# Patient Record
Sex: Male | Born: 1975 | Race: Black or African American | Hispanic: No | Marital: Single | Smoking: Current every day smoker
Health system: Southern US, Community
[De-identification: ages and names within clinical notes are randomized; demographics above are authoritative.]

---

## 2008-08-29 ENCOUNTER — Emergency Department (HOSPITAL_COMMUNITY): Admission: EM | Admit: 2008-08-29 | Discharge: 2008-08-29 | Payer: Self-pay | Admitting: *Deleted

## 2013-12-10 ENCOUNTER — Emergency Department (HOSPITAL_COMMUNITY): Admission: EM | Admit: 2013-12-10 | Discharge: 2013-12-10 | Discharge status: Absent without leave | Payer: Self-pay

## 2015-02-20 ENCOUNTER — Emergency Department (HOSPITAL_COMMUNITY)
Admission: EM | Admit: 2015-02-20 | Discharge: 2015-02-20 | Disposition: A | Discharge status: Home / Self Care | Payer: Self-pay

## 2015-02-20 NOTE — ED Notes (Signed)
Attempted to call patient x 3 and no response. Patient was in waiting room early and could not locate to bring back to triage

## 2016-11-20 ENCOUNTER — Encounter (HOSPITAL_COMMUNITY): Payer: Self-pay

## 2016-11-20 DIAGNOSIS — R1030 Lower abdominal pain, unspecified: Secondary | ICD-10-CM | POA: Insufficient documentation

## 2016-11-20 DIAGNOSIS — Z5321 Procedure and treatment not carried out due to patient leaving prior to being seen by health care provider: Secondary | ICD-10-CM | POA: Insufficient documentation

## 2016-11-20 LAB — COMPREHENSIVE METABOLIC PANEL
ALK PHOS: 66 U/L (ref 38–126)
ALT: 100 U/L — AB (ref 17–63)
ANION GAP: 11 (ref 5–15)
AST: 103 U/L — ABNORMAL HIGH (ref 15–41)
Albumin: 4.1 g/dL (ref 3.5–5.0)
BILIRUBIN TOTAL: 0.2 mg/dL — AB (ref 0.3–1.2)
BUN: 5 mg/dL — ABNORMAL LOW (ref 6–20)
CALCIUM: 8.5 mg/dL — AB (ref 8.9–10.3)
CO2: 20 mmol/L — ABNORMAL LOW (ref 22–32)
Chloride: 107 mmol/L (ref 101–111)
Creatinine, Ser: 0.58 mg/dL — ABNORMAL LOW (ref 0.61–1.24)
Glucose, Bld: 137 mg/dL — ABNORMAL HIGH (ref 65–99)
Potassium: 3.6 mmol/L (ref 3.5–5.1)
Sodium: 138 mmol/L (ref 135–145)
TOTAL PROTEIN: 6.9 g/dL (ref 6.5–8.1)

## 2016-11-20 LAB — CBC
HCT: 41.7 % (ref 39.0–52.0)
HEMOGLOBIN: 14.2 g/dL (ref 13.0–17.0)
MCH: 33.2 pg (ref 26.0–34.0)
MCHC: 34.1 g/dL (ref 30.0–36.0)
MCV: 97.4 fL (ref 78.0–100.0)
PLATELETS: 293 10*3/uL (ref 150–400)
RBC: 4.28 MIL/uL (ref 4.22–5.81)
RDW: 14.8 % (ref 11.5–15.5)
WBC: 9.8 10*3/uL (ref 4.0–10.5)

## 2016-11-20 LAB — URINALYSIS, ROUTINE W REFLEX MICROSCOPIC
Bacteria, UA: NONE SEEN
Bilirubin Urine: NEGATIVE
Glucose, UA: NEGATIVE mg/dL
Hgb urine dipstick: NEGATIVE
Ketones, ur: NEGATIVE mg/dL
Leukocytes, UA: NEGATIVE
NITRITE: NEGATIVE
PH: 5 (ref 5.0–8.0)
Protein, ur: NEGATIVE mg/dL
SPECIFIC GRAVITY, URINE: 1.015 (ref 1.005–1.030)

## 2016-11-20 LAB — LIPASE, BLOOD: Lipase: 24 U/L (ref 11–51)

## 2016-11-20 MED ORDER — ACETAMINOPHEN 500 MG PO TABS
ORAL_TABLET | ORAL | Status: AC
  Filled 2016-11-20: qty 1

## 2016-11-20 MED ORDER — ONDANSETRON 4 MG PO TBDP
ORAL_TABLET | ORAL | Status: AC
  Filled 2016-11-20: qty 1

## 2016-11-20 NOTE — ED Triage Notes (Signed)
Pt presents to the ed with complaints of lower abdominal pain that feels like someone is squeezing states it has been going on all day and he has thrown up several times today.

## 2016-11-21 ENCOUNTER — Emergency Department (HOSPITAL_COMMUNITY)
Admission: EM | Admit: 2016-11-21 | Discharge: 2016-11-21 | Disposition: A | Discharge status: Home / Self Care | Payer: Self-pay | Attending: Emergency Medicine | Admitting: Emergency Medicine

## 2016-12-02 ENCOUNTER — Emergency Department (HOSPITAL_COMMUNITY)
Admission: EM | Admit: 2016-12-02 | Discharge: 2016-12-03 | Disposition: A | Discharge status: Home / Self Care | Payer: Self-pay | Attending: Emergency Medicine | Admitting: Emergency Medicine

## 2016-12-02 ENCOUNTER — Encounter (HOSPITAL_COMMUNITY): Payer: Self-pay | Admitting: Emergency Medicine

## 2016-12-02 DIAGNOSIS — F10920 Alcohol use, unspecified with intoxication, uncomplicated: Secondary | ICD-10-CM

## 2016-12-02 DIAGNOSIS — F17200 Nicotine dependence, unspecified, uncomplicated: Secondary | ICD-10-CM | POA: Insufficient documentation

## 2016-12-02 DIAGNOSIS — F1020 Alcohol dependence, uncomplicated: Secondary | ICD-10-CM | POA: Diagnosis present

## 2016-12-02 DIAGNOSIS — F1014 Alcohol abuse with alcohol-induced mood disorder: Secondary | ICD-10-CM | POA: Insufficient documentation

## 2016-12-02 DIAGNOSIS — Z046 Encounter for general psychiatric examination, requested by authority: Secondary | ICD-10-CM | POA: Insufficient documentation

## 2016-12-02 LAB — COMPREHENSIVE METABOLIC PANEL
ALT: 30 U/L (ref 17–63)
AST: 44 U/L — ABNORMAL HIGH (ref 15–41)
Albumin: 4.8 g/dL (ref 3.5–5.0)
Alkaline Phosphatase: 59 U/L (ref 38–126)
Anion gap: 12 (ref 5–15)
BUN: 8 mg/dL (ref 6–20)
CHLORIDE: 109 mmol/L (ref 101–111)
CO2: 21 mmol/L — AB (ref 22–32)
CREATININE: 0.68 mg/dL (ref 0.61–1.24)
Calcium: 8.6 mg/dL — ABNORMAL LOW (ref 8.9–10.3)
GFR calc Af Amer: >60 mL/min (ref >60)
GFR calc non Af Amer: >60 mL/min (ref >60)
Glucose, Bld: 91 mg/dL (ref 65–99)
POTASSIUM: 3.8 mmol/L (ref 3.5–5.1)
SODIUM: 142 mmol/L (ref 135–145)
Total Bilirubin: 0.5 mg/dL (ref 0.3–1.2)
Total Protein: 7.9 g/dL (ref 6.5–8.1)

## 2016-12-02 LAB — CBC
HCT: 41.7 % (ref 39.0–52.0)
HEMOGLOBIN: 14.6 g/dL (ref 13.0–17.0)
MCH: 33.8 pg (ref 26.0–34.0)
MCHC: 35 g/dL (ref 30.0–36.0)
MCV: 96.5 fL (ref 78.0–100.0)
Platelets: 266 10*3/uL (ref 150–400)
RBC: 4.32 MIL/uL (ref 4.22–5.81)
RDW: 14.6 % (ref 11.5–15.5)
WBC: 8.8 10*3/uL (ref 4.0–10.5)

## 2016-12-02 LAB — ETHANOL
ALCOHOL ETHYL (B): 237 mg/dL — AB (ref <5)
Alcohol, Ethyl (B): 416 mg/dL (ref <5)

## 2016-12-02 LAB — RAPID URINE DRUG SCREEN, HOSP PERFORMED
AMPHETAMINES: NOT DETECTED
BENZODIAZEPINES: NOT DETECTED
Barbiturates: NOT DETECTED
COCAINE: NOT DETECTED
OPIATES: NOT DETECTED
TETRAHYDROCANNABINOL: NOT DETECTED

## 2016-12-02 LAB — ACETAMINOPHEN LEVEL

## 2016-12-02 LAB — SALICYLATE LEVEL

## 2016-12-02 MED ORDER — LORAZEPAM 2 MG/ML IJ SOLN: 0.0000 mg | Freq: Two times a day (BID) | INTRAMUSCULAR | Status: DC

## 2016-12-02 MED ORDER — LORAZEPAM 1 MG PO TABS: 0.0000 mg | ORAL_TABLET | Freq: Four times a day (QID) | ORAL | Status: DC

## 2016-12-02 MED ORDER — LORAZEPAM 1 MG PO TABS: 0.0000 mg | ORAL_TABLET | Freq: Two times a day (BID) | ORAL | Status: DC

## 2016-12-02 MED ORDER — LORAZEPAM 2 MG/ML IJ SOLN: 0.0000 mg | Freq: Four times a day (QID) | INTRAMUSCULAR | Status: DC

## 2016-12-02 MED ORDER — ONDANSETRON HCL 4 MG PO TABS: 4.0000 mg | ORAL_TABLET | Freq: Three times a day (TID) | ORAL | Status: DC | PRN

## 2016-12-02 MED ORDER — VITAMIN B-1 100 MG PO TABS
100.0000 mg | ORAL_TABLET | Freq: Every day | ORAL | Status: DC
Start: 2016-12-03 — End: 2016-12-03

## 2016-12-02 MED ORDER — ALUM & MAG HYDROXIDE-SIMETH 200-200-20 MG/5ML PO SUSP: 30.0000 mL | Freq: Four times a day (QID) | ORAL | Status: DC | PRN

## 2016-12-02 MED ORDER — THIAMINE HCL 100 MG/ML IJ SOLN: 100.0000 mg | Freq: Every day | INTRAMUSCULAR | Status: DC

## 2016-12-02 MED ORDER — ACETAMINOPHEN 325 MG PO TABS: 650.0000 mg | ORAL_TABLET | ORAL | Status: DC | PRN

## 2016-12-02 MED ORDER — NICOTINE 21 MG/24HR TD PT24: 21.0000 mg | MEDICATED_PATCH | Freq: Every day | TRANSDERMAL | Status: DC

## 2016-12-02 NOTE — ED Notes (Signed)
ED Provider at bedside. 

## 2016-12-02 NOTE — ED Provider Notes (Signed)
[facility]-EMERGENCY DEPT Provider Note   CSN: [id number] Arrival date & time: 12/02/16  1554     History   Chief Complaint Chief Complaint  Patient presents with  . IVC    HPI Billy Diaz is a 41 y.o. male who presents to the ED under IVC. Paperwork states that he is an ETOH abuser and that he states that he wanted to kill himself. Paperwork also states that he is aggressive and abusive.Patient denies SI/HI/AVH. He states that his wife has been hanging out with a friend who is prostituting. He says that when he came home the friend was with his wife. He says that he left and would not speak to her, so she took out paperwork. Patient has no medical complaints. He denies illicit drug use.  He has no previous psych hx.  HPI  History reviewed. No pertinent past medical history.  There are no active problems to display for this patient.   History reviewed. No pertinent surgical history.     Home Medications    Prior to Admission medications   Not on File    Family History No family history on file.  Social History Social History  Substance Use Topics  . Smoking status: Current Every Day Smoker  . Smokeless tobacco: Never Used  . Alcohol use Yes     Comment: occassionally      Allergies   Patient has no known allergies.   Review of Systems Review of Systems  Ten systems reviewed and are negative for acute change, except as noted in the HPI.   Physical Exam Updated Vital Signs BP (!) 144/105 (BP Location: Left Arm)   Pulse 78   Temp 98.5 F (36.9 C) (Oral)   Resp 18   SpO2 98%   Physical Exam  Constitutional: He is oriented to person, place, and time. He appears well-developed and well-nourished. No distress.  HENT:  Head: Normocephalic and atraumatic.  Eyes: Conjunctivae are normal. No scleral icterus.  Neck: Normal range of motion. Neck supple.  Cardiovascular: Normal rate, regular rhythm and normal heart sounds.   Pulmonary/Chest: Effort normal and  breath sounds normal. No respiratory distress.  Abdominal: Soft. There is no tenderness.  Musculoskeletal: He exhibits no edema.  Neurological: He is alert and oriented to person, place, and time.  Answer questions appropriately Mild nystagmus suggestive of ETOH intoxication, however he appears clinically sober  Skin: Skin is warm and dry. He is not diaphoretic.  Psychiatric: His behavior is normal.  Nursing note and vitals reviewed.    ED Treatments / Results  Labs (all labs ordered are listed, but only abnormal results are displayed) Labs Reviewed  COMPREHENSIVE METABOLIC PANEL  ETHANOL  SALICYLATE LEVEL  ACETAMINOPHEN LEVEL  CBC  RAPID URINE DRUG SCREEN, HOSP PERFORMED    EKG  EKG Interpretation None       Radiology No results found.  Procedures Procedures (including critical care time)  Medications Ordered in ED Medications - No data to display   Initial Impression / Assessment and Plan / ED Course  I have reviewed the triage vital signs and the nursing notes.  Pertinent labs & imaging results that were available during my care of the patient were reviewed by me and considered in my medical decision making (see chart for details).  Clinical Course as of Dec 03 129  [name] Dec 02, 2016  2321 Alcohol, Ethyl (B): (!!) 416 [AH]    Clinical Course User Index [AH] Arthor Captain, PA-C  Patient under involuntary commitment. His alcohol level is 237. He appears clinically sober and appropriate for evaluation. He is otherwise medically clear for psychiatric consult. Final Clinical Impressions(s) / ED Diagnoses   Final diagnoses:  Involuntary commitment  Alcoholic intoxication without complication [facility](HCC)    New Prescriptions New Prescriptions   No medications on file     Arthor CaptainHarris, Dajane Valli, Cordelia Poche-C 12/03/16 0132    Rolland PorterJames, Mark, MD 12/12/16 1308

## 2016-12-02 NOTE — ED Triage Notes (Signed)
Patient brought in by [employer] under IVC papers that state: "A danger to self and others, to wit: abuses alcohol daily and when drunk becomes aggressive and abusive; drives drunk, saying he wants to die and would be better off dead."

## 2016-12-02 NOTE — ED Notes (Signed)
Pt is sleeping.  Respirations are even and unlabored.  Will continue to monitor.  Sitter at bedside.

## 2016-12-02 NOTE — ED Notes (Signed)
Bed: [facility] Expected date:  Expected time:  Means of arrival:  Comments: 

## 2016-12-02 NOTE — BH Assessment (Addendum)
Assessment Note  Billy BowMohamed Diaz is an 41 y.o. male. [facility] [employer] from his home. IVC initiated by patient's spouse. The IVC sts, ""A danger to self and others, to wit: abuses alcohol daily and when drunk becomes aggressive and abusive; drives drunk, saying he wants to die and would be better off dead." Patient reports that he doesn't like when his wife hangs around a lady who sells herself for money. Today patient reports that he got home and wife was hanging out with that friend so patient left the house and wife took out papers so police brought him in to ED."    Patient denies any SI/HI at this time. No self mutilating behaviors. He admits to mild depressive symptoms associated with anger/irritability. Sts that his spouse is his trigger.  No AVH's. Patient doesn't appear to be responding to internal stimuli. Patient denies a overall psychiatric history.   Patient is calm and cooperative.  He denies a history of assaultive and/or combative behaviors. He is oriented x4. Mood is depressed. Speech is soft but appropriate. No drug use reported. He does admit to occasional alcohol binges. Sts, "I tend to drink a lot after I get off work to calm myself down". Last reported drink was yesterday. NO history of INPT or Outpatient mental health treatment.   Diagnosis: Alcohol Use Disorder  Past Medical History: History reviewed. No pertinent past medical history.  History reviewed. No pertinent surgical history.  Family History: No family history on file.  Social History:  reports that he has been smoking.  He has never used smokeless tobacco. He reports that he drinks alcohol. His drug history is not on file.  Additional Social History:  Alcohol / Drug Use Pain Medications: SEE MAR Prescriptions: SEE MAR Over the Counter: SEE MAR History of alcohol / drug use?: Yes Longest period of sobriety (when/how long): "yesterday; unk amt Substance #1 Name of Substance 1: Alcohol  1 - Age of  First Use: Patient unable to recall 1 - Amount (size/oz): binge drinks; pt did not state a specific amount of alcohol that he drinks 1 - Frequency: "A couple times per week" 1 - Duration: on-going   CIWA: CIWA-Ar BP: (!) 144/105 Pulse Rate: 78 COWS:    Allergies: No Known Allergies  Home Medications:  (Not in a hospital admission)  OB/GYN Status:  No LMP for male patient.  General Assessment Data Location of Assessment: [facility] ED TTS Assessment: In system Is this a Tele or Face-to-Face Assessment?: Face-to-Face Is this an Initial Assessment or a Re-assessment for this encounter?: Initial Assessment Marital status: Single Maiden name:  (n/a) Is patient pregnant?: No Pregnancy Status: No Living Arrangements: Other (Comment) Admission Status: Voluntary Is patient capable of signing voluntary admission?: Yes Referral Source: Self/Family/Friend Insurance type:  (SP)     Crisis Care Plan Living Arrangements: Other (Comment) Legal Guardian: Other: (no legal guardian ) Name of Psychiatrist:  (no psychiatrist ) Name of Therapist:  (no therapist )  Education Status Is patient currently in school?: No Current Grade:  (n/a) Highest grade of school patient has completed:  [employer](High School ) Name of school:  (n/a) Contact person:  (n/a)  Risk to self with the past 6 months Suicidal Ideation: No Has patient been a risk to self within the past 6 months prior to admission? : No Suicidal Intent: No Has patient had any suicidal intent within the past 6 months prior to admission? : No Is patient at risk for suicide?: No  Suicidal Plan?: No Has patient had any suicidal plan within the past 6 months prior to admission? : No Access to Means: No What has been your use of drugs/alcohol within the last 12 months?:  (alcohol ) Previous Attempts/Gestures: No How many times?:  (0) Other Self Harm Risks:  (denies self harm ) Triggers for Past Attempts: Other (Comment) (no triggers for past  attempts ) Intentional Self Injurious Behavior: None Family Suicide History: No Recent stressful life event(s): Other (Comment) (patient denies ) Persecutory voices/beliefs?: No Depression: No Depression Symptoms: Feeling angry/irritable Substance abuse history and/or treatment for substance abuse?: No  Risk to Others within the past 6 months Homicidal Ideation: No Does patient have any lifetime risk of violence toward others beyond the six months prior to admission? : No Thoughts of Harm to Others: No Current Homicidal Intent: No Current Homicidal Plan: No Access to Homicidal Means: No Identified Victim:  (n/a) History of harm to others?: No Assessment of Violence: None Noted Violent Behavior Description:  (patient is calm and cooperative ) Does patient have access to weapons?: No Criminal Charges Pending?: No Does patient have a court date: No Is patient on probation?: No  Psychosis Hallucinations: None noted Delusions: None noted  Mental Status Report Appearance/Hygiene: Disheveled, Poor hygiene (malodorous) Eye Contact: Good Motor Activity: Freedom of movement Speech: Soft Level of Consciousness: Alert Mood: Depressed Affect: Depressed Anxiety Level: Minimal Thought Processes: Relevant Judgement: Impaired Orientation: Person, Place, Time, Situation Obsessive Compulsive Thoughts/Behaviors: None  Cognitive Functioning Concentration: Decreased Memory: Recent Intact, Remote Intact IQ: Average Insight: Fair Impulse Control: Fair Appetite: Good Weight Loss:  (none reported) Weight Gain:  (none reportede) Sleep: No Change Total Hours of Sleep:  (n/a) Vegetative Symptoms: None  ADLScreening [facility] Assessment Services) Patient's cognitive ability adequate to safely complete daily activities?: Yes Patient able to express need for assistance with ADLs?: Yes Independently performs ADLs?: No  Prior Inpatient Therapy Prior Inpatient Therapy: No Prior Therapy Dates:   (n/a) Prior Therapy Facilty/Provider(s):  (n/a) Reason for Treatment:  (n/a)  Prior Outpatient Therapy Prior Outpatient Therapy: No Prior Therapy Facilty/Provider(s):  (n/a) Reason for Treatment:  (n/a) Does patient have an ACCT team?: No Does patient have Intensive In-House Services?  : No Does patient have [facility] services? : No Does patient have P4CC services?: No  ADL Screening (condition at time of admission) Patient's cognitive ability adequate to safely complete daily activities?: Yes Patient able to express need for assistance with ADLs?: Yes Independently performs ADLs?: No             Advance Directives (For Healthcare) Does Patient Have a Medical Advance Directive?: No Would patient like information on creating a medical advance directive?: No - Patient declined Nutrition Screen- [facility] Patient's home diet: Regular  Additional Information 1:1 In Past 12 Months?: No CIRT Risk: No Elopement Risk: No Does patient have medical clearance?: Yes     Disposition:  Per Nanine Means, DNP, patient to remain in the ED overnight. Pending am psych evaluation.   Disposition Initial Assessment Completed for this Encounter: Yes Disposition of Patient: Referred to Type of inpatient treatment program:  (overnight observation; pending am psych evaluation )  On Site Evaluation by:   Reviewed with Physician:    Melynda Ripple 12/02/2016 5:23 PM

## 2016-12-02 NOTE — ED Notes (Addendum)
Patient reports that he doesn't like when his wife hangs around a lady who sells herself for money. Today patient reports that he got home and wife was hanging out with that friend so patient left the house and wife took out papers so police brought him in to ED.  Patient denies any SI/HI at this time.  Patient does admit to drinking beers after getting home after working.

## 2016-12-03 DIAGNOSIS — F17210 Nicotine dependence, cigarettes, uncomplicated: Secondary | ICD-10-CM

## 2016-12-03 DIAGNOSIS — F1020 Alcohol dependence, uncomplicated: Secondary | ICD-10-CM | POA: Diagnosis present

## 2016-12-03 DIAGNOSIS — F1014 Alcohol abuse with alcohol-induced mood disorder: Secondary | ICD-10-CM

## 2016-12-03 DIAGNOSIS — F1910 Other psychoactive substance abuse, uncomplicated: Secondary | ICD-10-CM

## 2016-12-03 LAB — ETHANOL: Alcohol, Ethyl (B): 187 mg/dL — ABNORMAL HIGH (ref <5)

## 2016-12-03 MED ORDER — GABAPENTIN 300 MG PO CAPS
300.0000 mg | ORAL_CAPSULE | Freq: Three times a day (TID) | ORAL | Status: DC
Start: 2016-12-03 — End: 2016-12-03

## 2016-12-03 MED ORDER — GABAPENTIN 300 MG PO CAPS: 300.0000 mg | ORAL_CAPSULE | Freq: Three times a day (TID) | ORAL | 0 refills | Status: DC

## 2016-12-03 NOTE — ED Notes (Signed)
Pt stated "I just got married 2 wks ago.  I'm from [place] and she's from here.  I never knew anything like this could happen (IVC).  She is just being jealous.  I have been staying with a friend since Thursday.  I've called and talked to her and everything.  I just can't believe this can happen."  Pt denies SI/HI.

## 2016-12-03 NOTE — BH Assessment (Signed)
[facility] Assessment Progress Note  Per Thedore MinsMojeed Akintayo, MD, this pt does not require psychiatric hospitalization at this time.  Pt presents under IVC initiated by his spouse, which Dr Jannifer FranklinAkintayo has rescinded.  [facility] with [facility].  This has been included in pt's discharge instructions.  Pt's nurse, Aram BeechamCynthia, has been notified.  Doylene Canninghomas Eldridge Marcott, MA Triage Specialist [phone]

## 2016-12-03 NOTE — [facility] Suicide Risk Assessment (Signed)
Suicide Risk Assessment  Discharge Assessment   [facility]   Principal Problem: Alcohol abuse with alcohol-induced mood disorder [facility](HCC) Discharge Diagnoses:  Patient Active Problem List   Diagnosis Date Noted  . Alcohol use disorder, severe, dependence (HCC) [F10.20] 12/03/2016    Priority: High  . 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 He appears well-developed and well-nourished.  HENT:  Head: Normocephalic.  Neck: Normal range of motion.  Respiratory: Effort normal and breath sounds normal.  Musculoskeletal: Normal range of motion.  Neurological: He is alert and oriented to person, place, and time.  Psychiatric: He has a normal mood and affect. His speech is normal and behavior is normal. Judgment and thought content normal. Cognition and memory are normal.    Review of Systems  Psychiatric/Behavioral: Positive for substance abuse.  All other systems reviewed and are negative.   Blood pressure 126/75, pulse 72, temperature 98.8 F (37.1 C), temperature source Oral, resp. rate 16, SpO2 95 %.There is no height or weight on file to calculate BMI.  General Appearance: Casual  Eye Contact:  Good  Speech:  Normal Rate  Volume:  Normal  Mood:  Euthymic  Affect:  Congruent  Thought Process:  Coherent and Descriptions of Associations: Intact  Orientation:  Full (Time, Place, and Person)  Thought Content:  WDL and Logical  Suicidal Thoughts:  No  Homicidal Thoughts:  No  Memory:  Immediate;   Good Recent;   Good Remote;   Good  Judgement:  Fair  Insight:  Fair  Psychomotor Activity:  Normal  Concentration:  Concentration: Good and Attention  Span: Good  Recall:  Good  Fund of Knowledge:  Good  Language:  Good  Akathisia:  No  Handed:  Right  AIMS (if indicated):     Assets:  Housing Intimacy Leisure Time Physical Health Resilience Social Support  ADL's:  Intact  Cognition:  WNL  Sleep:      Mental Status Per Nursing Assessment::   On Admission:   alcohol abuse  Demographic Factors:  Male  Loss Factors: NA  Historical Factors: NA  Risk Reduction Factors:   Responsible for children under 39 years of age, Sense of responsibility to family, Employed, Living with another person, especially a relative and Positive social support  Continued Clinical Symptoms:  None  Cognitive Features That Contribute To Risk:  None    Suicide Risk:  Minimal: No identifiable suicidal ideation.  Patients presenting with no risk factors but with morbid ruminations; may be classified as minimal risk based on the severity of the depressive symptoms    Plan Of Care/Follow-up recommendations:  Activity:  as tolerated Diet:  heart healthy diet  LORD, JAMISON, NP 12/03/2016, 12:48 PM

## 2016-12-03 NOTE — ED Notes (Signed)
Bed: WA27 Expected date:  Expected time:  Means of arrival:  Comments: Hall D 

## 2016-12-03 NOTE — Consult Note (Addendum)
[place] Consult   Reason for Consult:  Alcohol abuse with IVC by his wife Referring Physician:  EDP Patient Identification: Billy Diaz MRN:  [id number] Principal Diagnosis: Alcohol abuse with alcohol-induced mood disorder [facility]) Diagnosis:   Patient Active Problem List   Diagnosis Date Noted  . Alcohol use disorder, severe, dependence ([place]) [F10.20] 12/03/2016    Priority: High  . Alcohol abuse with alcohol-induced mood disorder ([place]) [F10.14] 12/03/2016    Priority: High    Total Time spent with patient: 45 minutes  Subjective:   [name] is a 41 y.o. male patient does not warrant admission.  HPI:  41 yo male who was IVC'd by his wife for drinking.  He denies suicidal/homicidal ideations and has no interest in rehab or detox.  He does, however, plan on leaving his wife as he is tired of their issues.  No withdrawal symptoms but gabapentin Rx provided to prevent any issues.  No hallucinations, calm and cooperative.  Stable for discharge.  Past Psychiatric History: alcohol abuse  Risk to Self: Suicidal Ideation: No Suicidal Intent: No Is patient at risk for suicide?: No Suicidal Plan?: No Access to Means: No What has been your use of drugs/alcohol within the last 12 months?:  (alcohol ) How many times?:  (0) Other Self Harm Risks:  (denies self harm ) Triggers for Past Attempts: Other (Comment) (no triggers for past attempts ) Intentional Self Injurious Behavior: None Risk to Others: Homicidal Ideation: No Thoughts of Harm to Others: No Current Homicidal Intent: No Current Homicidal Plan: No Access to Homicidal Means: No Identified Victim:  (n/a) History of harm to others?: No Assessment of Violence: None Noted Violent Behavior Description:  (patient is calm and cooperative ) Does patient have access to weapons?: No Criminal Charges Pending?: No Does patient have a court date: No Prior Inpatient Therapy: Prior Inpatient Therapy: No Prior  Therapy Dates:  (n/a) Prior Therapy Facilty/Provider(s):  (n/a) Reason for Treatment:  (n/a) Prior Outpatient Therapy: Prior Outpatient Therapy: No Prior Therapy Facilty/Provider(s):  (n/a) Reason for Treatment:  (n/a) Does patient have an ACCT team?: No Does patient have Intensive In-House Services?  : No Does patient have [facility] services? : No Does patient have [facility] services?: No  Past Medical History: History reviewed. No pertinent past medical history. History reviewed. No pertinent surgical history. Family History: No family history on file. Family Psychiatric  History: none  Social History:  History  Alcohol Use  . Yes    Comment: occassionally      History  Drug use: Unknown    Social History   Social History  . Marital status: Single    Spouse name: N/A  . Number of children: N/A  . Years of education: N/A   Social History Main Topics  . Smoking status: Current Every Day Smoker  . Smokeless tobacco: Never Used  . Alcohol use Yes     Comment: occassionally   . Drug use: Unknown  . Sexual activity: Not Asked   Other Topics Concern  . None   Social History Narrative  . None   Additional Social History:    Allergies:  No Known Allergies  Labs:  Results for orders placed or performed during the hospital encounter of 12/02/16 (from the past 48 hour(s))  Comprehensive metabolic panel     Status: Abnormal   Collection Time: 12/02/16  4:40 PM  Result Value Ref Range   Sodium 142 135 - 145 mmol/L   Potassium 3.8 3.5 -  5.1 mmol/L   Chloride 109 101 - 111 mmol/L   CO2 21 (L) 22 - 32 mmol/L   Glucose, Bld 91 65 - 99 mg/dL   BUN 8 6 - 20 mg/dL   Creatinine, Ser 0.68 0.61 - 1.24 mg/dL   Calcium 8.6 (L) 8.9 - 10.3 mg/dL   Total Protein 7.9 6.5 - 8.1 g/dL   Albumin 4.8 3.5 - 5.0 g/dL   AST 44 (H) 15 - 41 U/L   ALT 30 17 - 63 U/L   Alkaline Phosphatase 59 38 - 126 U/L   Total Bilirubin 0.5 0.3 - 1.2 mg/dL   GFR calc non Af Amer >60 >60 mL/min   GFR calc Af  Amer >60 >60 mL/min    Comment: (NOTE) The eGFR has been calculated using the CKD EPI equation. This calculation has not been validated in all clinical situations. eGFR's persistently <60 mL/min signify possible Chronic Kidney Disease.    Anion gap 12 5 - 15  Ethanol     Status: Abnormal   Collection Time: 12/02/16  4:40 PM  Result Value Ref Range   Alcohol, Ethyl (B) 416 (HH) <5 mg/dL    Comment:        LOWEST DETECTABLE LIMIT FOR SERUM ALCOHOL IS 5 mg/dL FOR MEDICAL PURPOSES ONLY CRITICAL RESULT CALLED TO, READ BACK BY AND VERIFIED WITH: THORNTON,S. RN _0  ON 27.25.36 BY COHEN,K   Salicylate level     Status: None   Collection Time: 12/02/16  4:40 PM  Result Value Ref Range   Salicylate Lvl <6.4 2.8 - 30.0 mg/dL  Acetaminophen level     Status: Abnormal   Collection Time: 12/02/16  4:40 PM  Result Value Ref Range   Acetaminophen (Tylenol), Serum <10 (L) 10 - 30 ug/mL    Comment:        THERAPEUTIC CONCENTRATIONS VARY SIGNIFICANTLY. A RANGE OF 10-30 ug/mL MAY BE AN EFFECTIVE CONCENTRATION FOR MANY PATIENTS. HOWEVER, SOME ARE BEST TREATED AT CONCENTRATIONS OUTSIDE THIS RANGE. ACETAMINOPHEN CONCENTRATIONS >150 ug/mL AT 4 HOURS AFTER INGESTION AND >50 ug/mL AT 12 HOURS AFTER INGESTION ARE OFTEN ASSOCIATED WITH TOXIC REACTIONS.   cbc     Status: None   Collection Time: 12/02/16  4:40 PM  Result Value Ref Range   WBC 8.8 4.0 - 10.5 K/uL   RBC 4.32 4.22 - 5.81 MIL/uL   Hemoglobin 14.6 13.0 - 17.0 g/dL   HCT 41.7 39.0 - 52.0 %   MCV 96.5 78.0 - 100.0 fL   MCH 33.8 26.0 - 34.0 pg   MCHC 35.0 30.0 - 36.0 g/dL   RDW 14.6 11.5 - 15.5 %   Platelets 266 150 - 400 K/uL  Rapid urine drug screen (hospital performed)     Status: None   Collection Time: 12/02/16  4:40 PM  Result Value Ref Range   Opiates NONE DETECTED NONE DETECTED   Cocaine NONE DETECTED NONE DETECTED   Benzodiazepines NONE DETECTED NONE DETECTED   Amphetamines NONE DETECTED NONE DETECTED    Tetrahydrocannabinol NONE DETECTED NONE DETECTED   Barbiturates NONE DETECTED NONE DETECTED    Comment:        DRUG SCREEN FOR MEDICAL PURPOSES ONLY.  IF CONFIRMATION IS NEEDED FOR ANY PURPOSE, NOTIFY LAB WITHIN 5 DAYS.        LOWEST DETECTABLE LIMITS FOR URINE DRUG SCREEN Drug Class       Cutoff (ng/mL) Amphetamine      1000 Barbiturate      200 Benzodiazepine  546 Tricyclics       270 Opiates          300 Cocaine          300 THC              50   Ethanol     Status: Abnormal   Collection Time: 12/02/16 11:25 PM  Result Value Ref Range   Alcohol, Ethyl (B) 237 (H) <5 mg/dL    Comment:        LOWEST DETECTABLE LIMIT FOR SERUM ALCOHOL IS 5 mg/dL FOR MEDICAL PURPOSES ONLY   Ethanol     Status: Abnormal   Collection Time: 12/03/16  1:41 AM  Result Value Ref Range   Alcohol, Ethyl (B) 187 (H) <5 mg/dL    Comment:        LOWEST DETECTABLE LIMIT FOR SERUM ALCOHOL IS 5 mg/dL FOR MEDICAL PURPOSES ONLY     Current Facility-Administered Medications  Medication Dose Route Frequency Provider Last Rate Last Dose  . acetaminophen (TYLENOL) tablet 650 mg  650 mg Oral Q4H PRN Harris, Abigail, PA-C      . alum & mag hydroxide-simeth (MAALOX/MYLANTA) 200-200-20 MG/5ML suspension 30 mL  30 mL Oral Q6H PRN Margarita Mail, PA-C      . LORazepam (ATIVAN) injection 0-4 mg  0-4 mg Intravenous Q6H Harris, Abigail, PA-C       Or  . LORazepam (ATIVAN) tablet 0-4 mg  0-4 mg Oral Q6H Harris, Abigail, PA-C      . [START ON 12/05/2016] LORazepam (ATIVAN) injection 0-4 mg  0-4 mg Intravenous Q12H Harris, Abigail, PA-C       Or  . Derrill Memo ON 12/05/2016] LORazepam (ATIVAN) tablet 0-4 mg  0-4 mg Oral Q12H Harris, Abigail, PA-C      . nicotine (NICODERM CQ - dosed in mg/24 hours) patch 21 mg  21 mg Transdermal Daily Harris, Abigail, PA-C      . ondansetron (ZOFRAN) tablet 4 mg  4 mg Oral Q8H PRN Harris, Abigail, PA-C      . thiamine (VITAMIN B-1) tablet 100 mg  100 mg Oral Daily Harris, Abigail, PA-C        Or  . thiamine (B-1) injection 100 mg  100 mg Intravenous Daily Margarita Mail, PA-C       No current outpatient prescriptions on file.    Musculoskeletal: Strength & Muscle Tone: within normal limits Gait & Station: normal Patient leans: N/A  Psychiatric Specialty Exam: Physical Exam  Constitutional: He is oriented to person, place, and time. He appears well-developed and well-nourished.  HENT:  Head: Normocephalic.  Neck: Normal range of motion.  Respiratory: Effort normal and breath sounds normal.  Musculoskeletal: Normal range of motion.  Neurological: He is alert and oriented to person, place, and time.  Psychiatric: He has a normal mood and affect. His speech is normal and behavior is normal. Judgment and thought content normal. Cognition and memory are normal.    Review of Systems  Psychiatric/Behavioral: Positive for substance abuse.  All other systems reviewed and are negative.   Blood pressure 126/75, pulse 72, temperature 98.8 F (37.1 C), temperature source Oral, resp. rate 16, SpO2 95 %.There is no height or weight on file to calculate BMI.  General Appearance: Casual  Eye Contact:  Good  Speech:  Normal Rate  Volume:  Normal  Mood:  Euthymic  Affect:  Congruent  Thought Process:  Coherent and Descriptions of Associations: Intact  Orientation:  Full (Time, Place, and Person)  Thought Content:  WDL and Logical  Suicidal Thoughts:  No  Homicidal Thoughts:  No  Memory:  Immediate;   Good Recent;   Good Remote;   Good  Judgement:  Fair  Insight:  Fair  Psychomotor Activity:  Normal  Concentration:  Concentration: Good and Attention Span: Good  Recall:  Good  Fund of Knowledge:  Good  Language:  Good  Akathisia:  No  Handed:  Right  AIMS (if indicated):     Assets:  Housing Intimacy Leisure Time Physical Health Resilience Social Support  ADL's:  Intact  Cognition:  WNL  Sleep:        Treatment Plan Summary: Daily contact with patient to  assess and evaluate symptoms and progress in treatment, Medication management and Plan alcohol abuse with alcohol induced mood disorder:  -Crisis stabilization -Medication management:  Gabapentin 300 mg TID for alcohol withdrawal started -Rx provided along with [facility] referral -Individual and substance abuse counseling  Disposition: No evidence of imminent risk to self or others at present.    Waylan Boga, NP 12/03/2016 10:57 AM  Patient seen face-to-face for psychiatric evaluation, chart reviewed and case discussed with the physician extender and developed treatment plan. Reviewed the information documented and agree with the treatment plan. Corena Pilgrim, MD

## 2016-12-03 NOTE — Discharge Instructions (Signed)
To help you maintain a sober lifestyle, a substance abuse treatment program may be beneficial to you.  Contact [facility] at your earliest opportunity to ask about enrolling in their program: ° °     [facility] °     [address], [address] °     [place], [place] [address] °     [phone] °     New patients are seen at the walk-in clinic every Tuesday from 9:00 am - 12:00 pm. °

## 2017-12-17 ENCOUNTER — Encounter (HOSPITAL_COMMUNITY): Payer: Self-pay

## 2017-12-17 ENCOUNTER — Emergency Department (HOSPITAL_COMMUNITY)
Admission: EM | Admit: 2017-12-17 | Discharge: 2017-12-18 | Disposition: A | Discharge status: Home / Self Care | Payer: Self-pay | Attending: Emergency Medicine | Admitting: Emergency Medicine

## 2017-12-17 ENCOUNTER — Emergency Department (HOSPITAL_COMMUNITY): Payer: Self-pay

## 2017-12-17 DIAGNOSIS — Z79899 Other long term (current) drug therapy: Secondary | ICD-10-CM | POA: Insufficient documentation

## 2017-12-17 DIAGNOSIS — Y929 Unspecified place or not applicable: Secondary | ICD-10-CM | POA: Insufficient documentation

## 2017-12-17 DIAGNOSIS — F17200 Nicotine dependence, unspecified, uncomplicated: Secondary | ICD-10-CM | POA: Insufficient documentation

## 2017-12-17 DIAGNOSIS — Y9301 Activity, walking, marching and hiking: Secondary | ICD-10-CM | POA: Insufficient documentation

## 2017-12-17 DIAGNOSIS — Y999 Unspecified external cause status: Secondary | ICD-10-CM | POA: Insufficient documentation

## 2017-12-17 DIAGNOSIS — S52501A Unspecified fracture of the lower end of right radius, initial encounter for closed fracture: Secondary | ICD-10-CM | POA: Insufficient documentation

## 2017-12-17 DIAGNOSIS — S52601A Unspecified fracture of lower end of right ulna, initial encounter for closed fracture: Secondary | ICD-10-CM | POA: Insufficient documentation

## 2017-12-17 DIAGNOSIS — S62524A Nondisplaced fracture of distal phalanx of right thumb, initial encounter for closed fracture: Secondary | ICD-10-CM | POA: Insufficient documentation

## 2017-12-17 DIAGNOSIS — W010XXA Fall on same level from slipping, tripping and stumbling without subsequent striking against object, initial encounter: Secondary | ICD-10-CM | POA: Insufficient documentation

## 2017-12-17 MED ORDER — HYDROCODONE-ACETAMINOPHEN 5-325 MG PO TABS
1.0000 | ORAL_TABLET | Freq: Once | ORAL | Status: AC
Start: 2017-12-17 — End: 2017-12-17
  Administered 2017-12-17: 1 via ORAL
  Filled 2017-12-17: qty 1

## 2017-12-17 NOTE — ED Triage Notes (Signed)
Pt arrived by EMS from home with obvious right wrist deformity after tripping and falling. Pt. Reports using his right arm to catch himself. Extremity warm to touch with radial pulse intact. Pt. Given 150 fentanyl en route. A/o X4
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
## 2017-12-18 MED ORDER — HYDROCODONE-ACETAMINOPHEN 5-325 MG PO TABS: 1.0000 | ORAL_TABLET | ORAL | 0 refills | Status: AC | PRN

## 2017-12-18 MED ORDER — MORPHINE SULFATE (PF) 4 MG/ML IV SOLN
4.0000 mg | Freq: Once | INTRAVENOUS | Status: AC
  Administered 2017-12-18: 4 mg via INTRAVENOUS
  Filled 2017-12-18: qty 1

## 2017-12-18 MED ORDER — NALOXONE HCL 0.4 MG/ML IJ SOLN: 0.2000 mg | Freq: Once | INTRAMUSCULAR | Status: DC

## 2017-12-18 NOTE — ED Notes (Signed)
Ortho tech paged  

## 2017-12-18 NOTE — Discharge Instructions (Addendum)
Apply ice several times a day. Keep the wrist elevated as much as possible.

## 2019-12-21 IMAGING — DX DG WRIST COMPLETE 3+V*R*
3 series · 3 of 3 positions shown · non-contrast
Comparison: None.

CLINICAL DATA: Laceration of the right thumb. Right wrist deformity
after fall.

EXAM:
RIGHT WRIST - COMPLETE 3+ VIEW

[wrist pa]
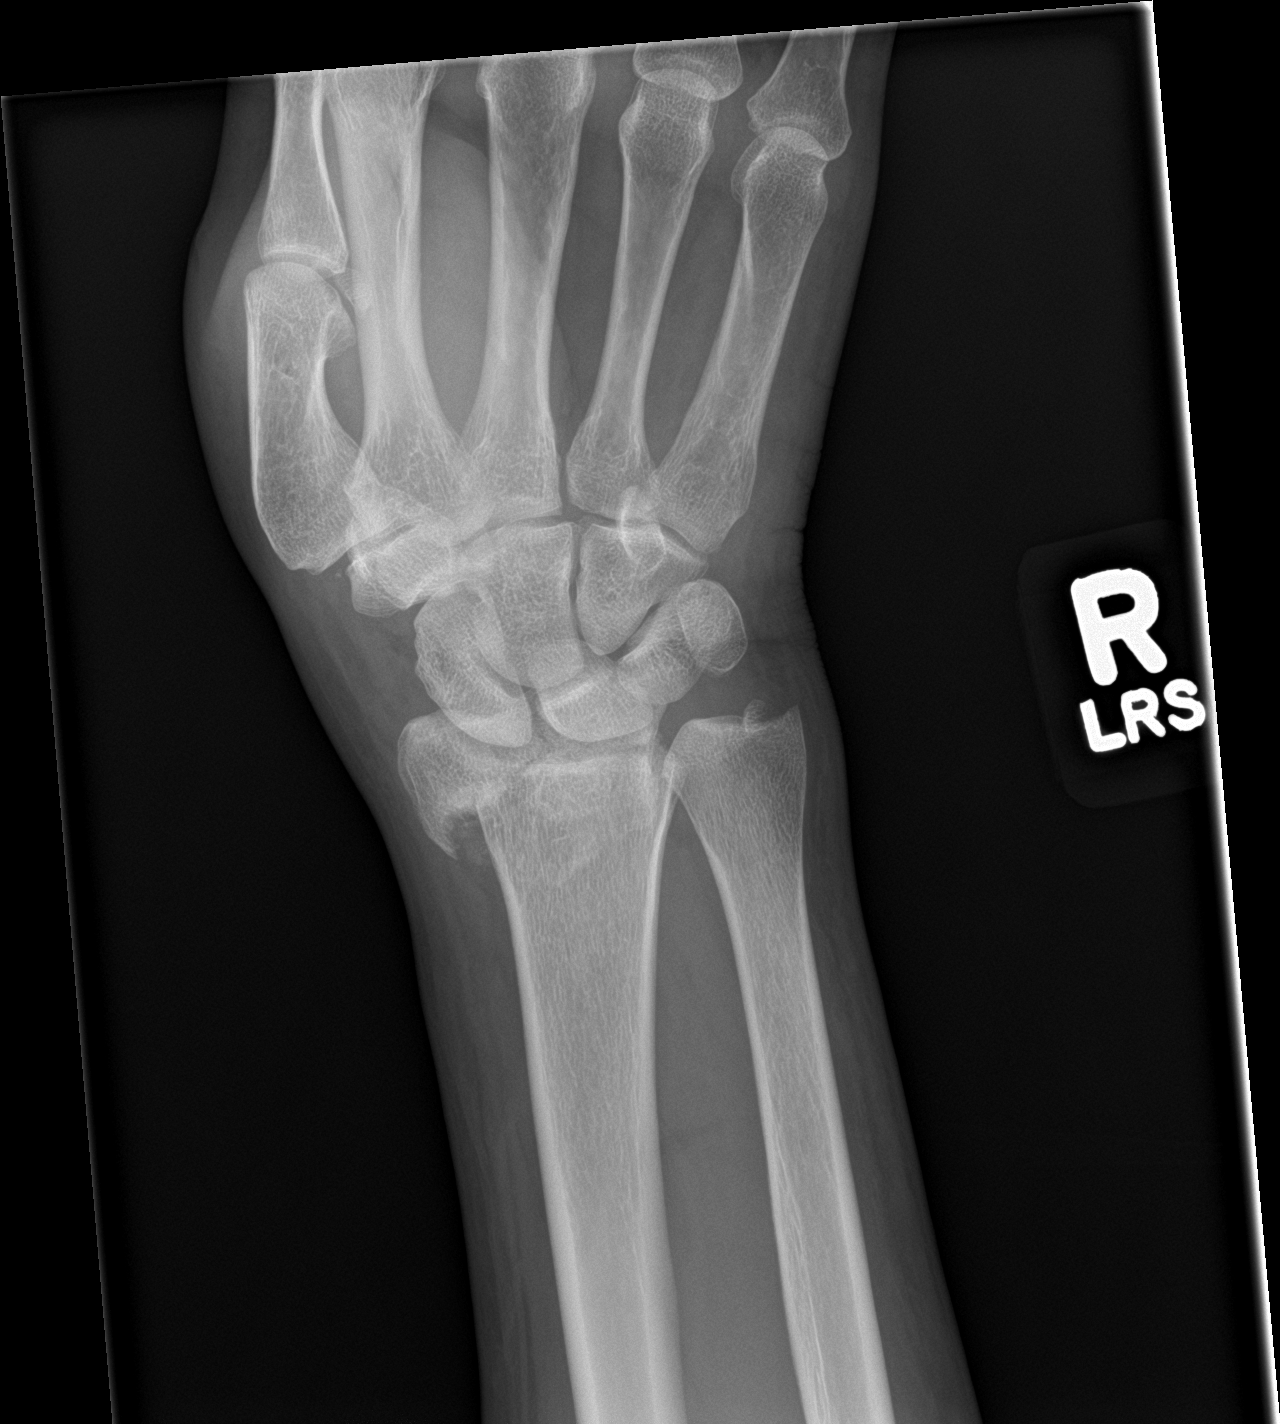

[wrist obl]
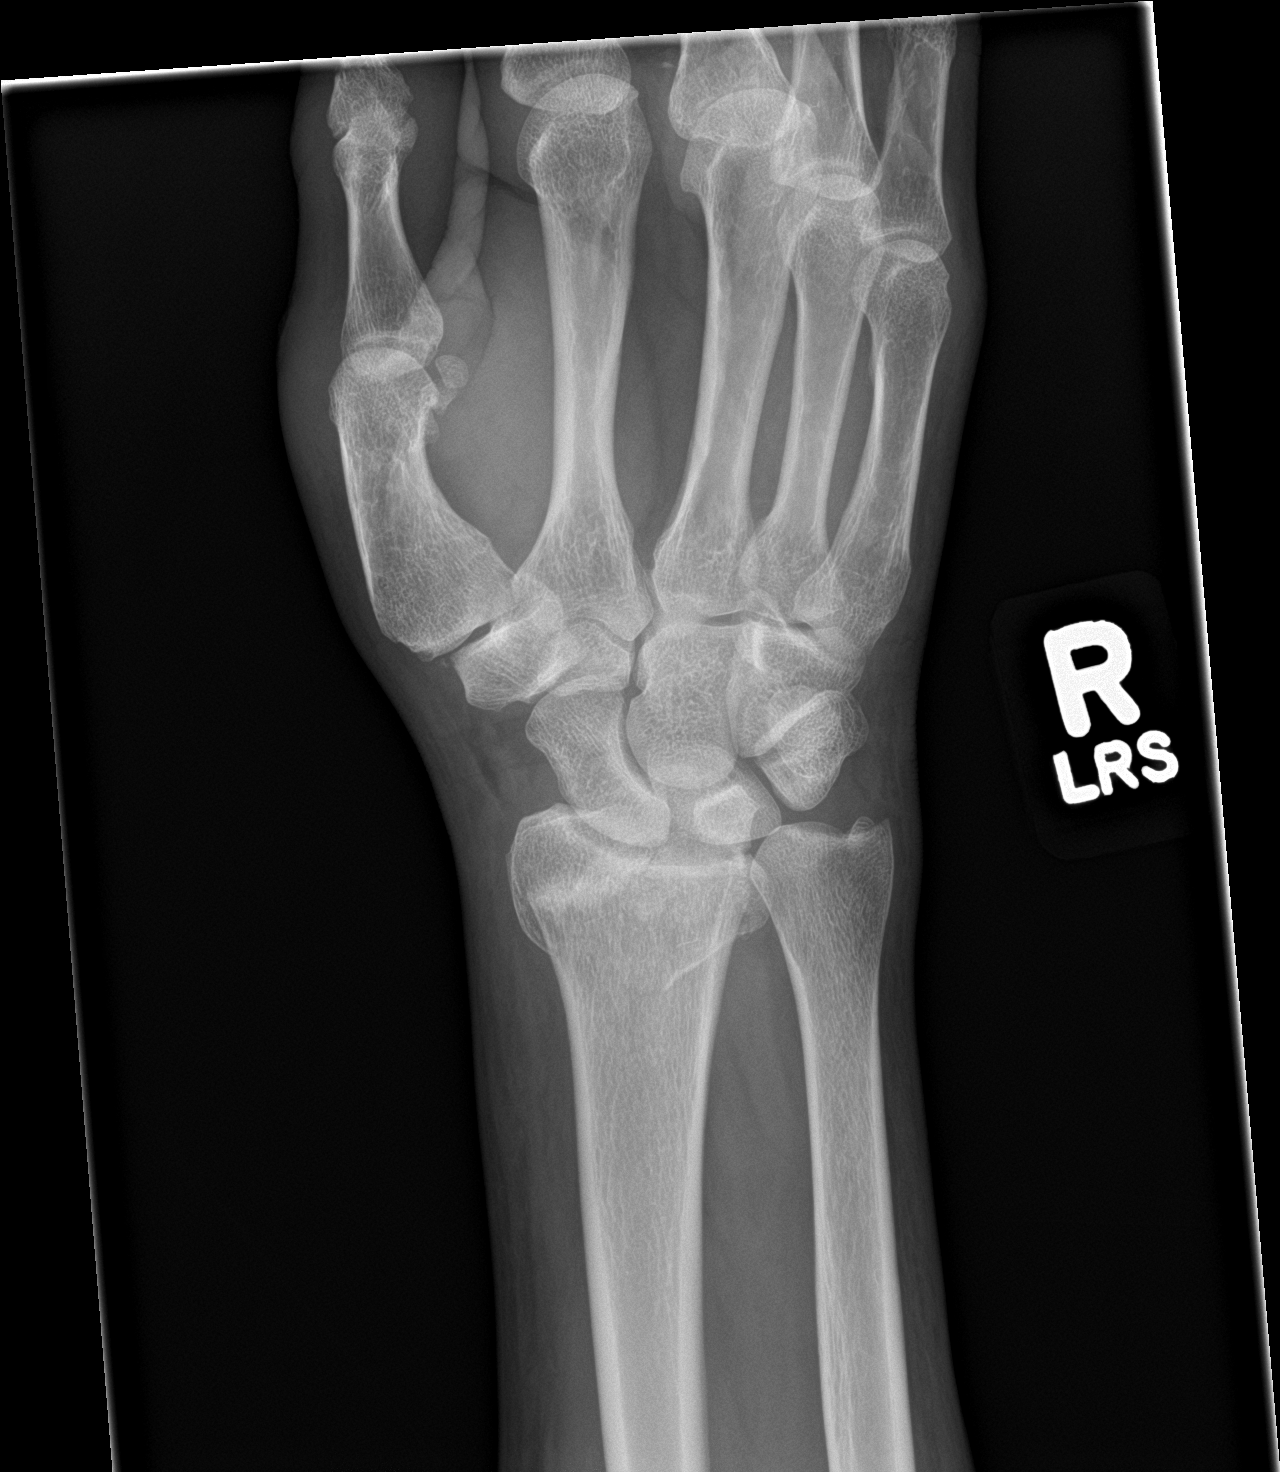

[wrist lat]
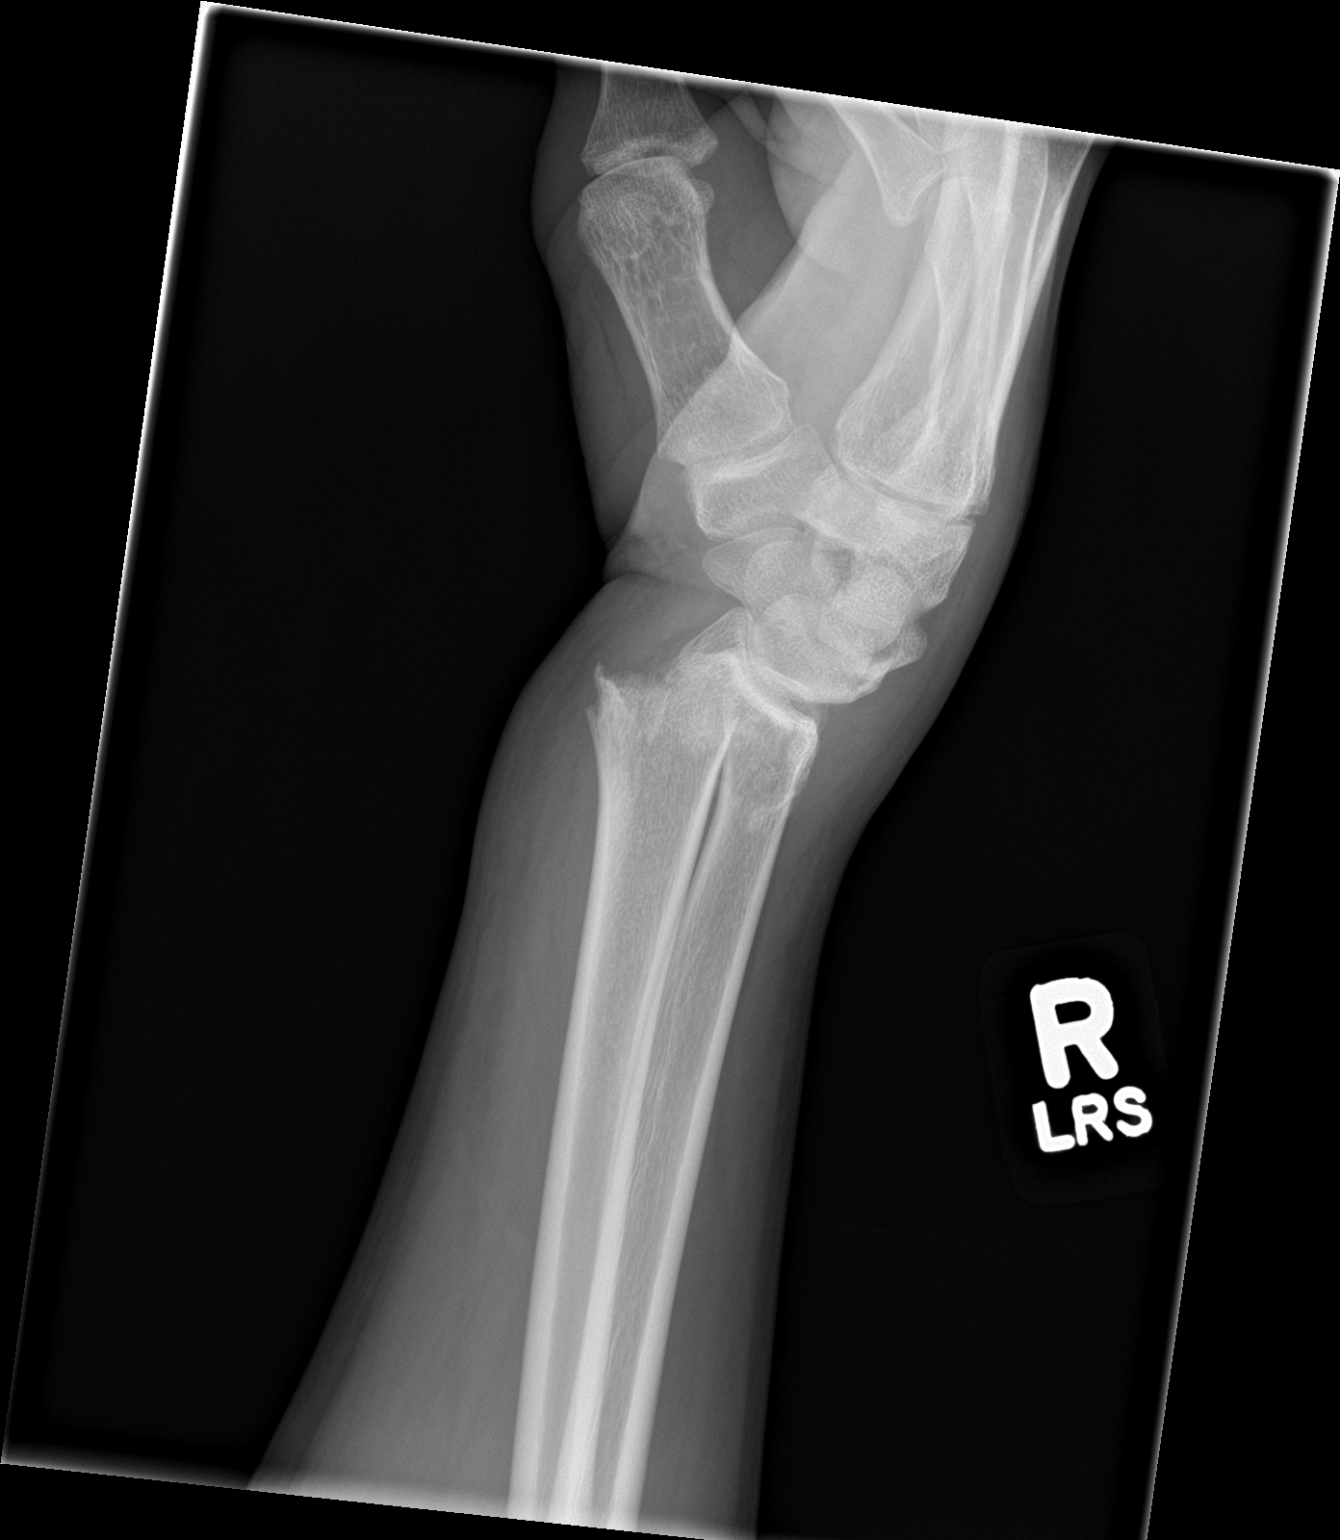

[3 of 3 positions shown; findings below may reference images not displayed]

FINDINGS: An acute, closed, dorsally angulated metaphyseal fracture of the
distal radius is noted with dorsal angulation of the distal fracture
fragment and radial displacement [DATE] shaft width. An intra-articular
component is seen extending into the radiocarpal joint at the level
of the scapholunate as well as distal radioulnar joint. A displaced
ulnar styloid fracture is also identified. There is soft tissue
swelling noted about the distal forearm and wrist as well as base of
thumb. Carpal rows are maintained without fracture or joint
dislocation.
IMPRESSION: 1. Acute, dorsally angulated, and slightly displaced fracture of the
distal radius with intra-articular extension into the radiocarpal
and distal radioulnar joints.
2. Slightly displaced ulnar styloid fracture.
3. Soft tissue swelling of the base of thumb, wrist and distal
forearm.

## 2020-06-21 ENCOUNTER — Emergency Department (HOSPITAL_COMMUNITY)
Admission: EM | Admit: 2020-06-21 | Discharge: 2020-06-22 | Disposition: A | Discharge status: Home / Self Care | Payer: Self-pay | Attending: Emergency Medicine | Admitting: Emergency Medicine

## 2020-06-21 ENCOUNTER — Other Ambulatory Visit: Payer: Self-pay

## 2020-06-21 ENCOUNTER — Encounter (HOSPITAL_COMMUNITY): Payer: Self-pay | Admitting: *Deleted

## 2020-06-21 DIAGNOSIS — Z5321 Procedure and treatment not carried out due to patient leaving prior to being seen by health care provider: Secondary | ICD-10-CM | POA: Insufficient documentation

## 2020-06-21 DIAGNOSIS — M25512 Pain in left shoulder: Secondary | ICD-10-CM | POA: Insufficient documentation

## 2020-06-21 DIAGNOSIS — Y92410 Unspecified street and highway as the place of occurrence of the external cause: Secondary | ICD-10-CM | POA: Insufficient documentation

## 2020-06-21 DIAGNOSIS — F1010 Alcohol abuse, uncomplicated: Secondary | ICD-10-CM | POA: Insufficient documentation

## 2020-06-21 DIAGNOSIS — M542 Cervicalgia: Secondary | ICD-10-CM | POA: Insufficient documentation
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
## 2020-06-22 NOTE — ED Notes (Signed)
Patient called for vitals recheck with no response
